# Patient Record
Sex: Female | Born: 1970 | Race: White | Hispanic: No | Marital: Married | Smoking: Never smoker
Health system: Southern US, Community
[De-identification: ages and names within clinical notes are randomized; demographics above are authoritative.]

## PROBLEM LIST (undated history)

## (undated) DIAGNOSIS — E785 Hyperlipidemia, unspecified: Secondary | ICD-10-CM

## (undated) DIAGNOSIS — N390 Urinary tract infection, site not specified: Secondary | ICD-10-CM

## (undated) DIAGNOSIS — L700 Acne vulgaris: Secondary | ICD-10-CM

## (undated) DIAGNOSIS — Z01419 Encounter for gynecological examination (general) (routine) without abnormal findings: Secondary | ICD-10-CM

## (undated) DIAGNOSIS — J189 Pneumonia, unspecified organism: Secondary | ICD-10-CM

## (undated) DIAGNOSIS — T7840XA Allergy, unspecified, initial encounter: Secondary | ICD-10-CM

## (undated) HISTORY — DX: Urinary tract infection, site not specified: N39.0

## (undated) HISTORY — DX: Acne vulgaris: L70.0

## (undated) HISTORY — DX: Encounter for gynecological examination (general) (routine) without abnormal findings: Z01.419

## (undated) HISTORY — DX: Allergy, unspecified, initial encounter: T78.40XA

## (undated) HISTORY — DX: Hyperlipidemia, unspecified: E78.5

## (undated) HISTORY — PX: NASAL SINUS SURGERY: SHX719

## (undated) HISTORY — DX: Pneumonia, unspecified organism: J18.9

---
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
## 1999-07-24 ENCOUNTER — Other Ambulatory Visit: Admission: RE | Admit: 1999-07-24 | Discharge: 1999-07-24 | Payer: Self-pay | Admitting: Gynecology

## 2000-07-12 ENCOUNTER — Inpatient Hospital Stay (HOSPITAL_COMMUNITY): Admission: AD | Admit: 2000-07-12 | Discharge: 2000-07-14 | Payer: Self-pay | Admitting: Gynecology

## 2000-07-15 ENCOUNTER — Encounter: Admission: RE | Admit: 2000-07-15 | Discharge: 2000-08-14 | Payer: Self-pay | Admitting: Gynecology

## 2000-09-09 ENCOUNTER — Other Ambulatory Visit: Admission: RE | Admit: 2000-09-09 | Discharge: 2000-09-09 | Payer: Self-pay | Admitting: Gynecology

## 2001-10-06 ENCOUNTER — Other Ambulatory Visit: Admission: RE | Admit: 2001-10-06 | Discharge: 2001-10-06 | Payer: Self-pay | Admitting: Gynecology

## 2002-11-16 ENCOUNTER — Other Ambulatory Visit: Admission: RE | Admit: 2002-11-16 | Discharge: 2002-11-16 | Payer: Self-pay | Admitting: Gynecology

## 2003-12-16 ENCOUNTER — Other Ambulatory Visit: Admission: RE | Admit: 2003-12-16 | Discharge: 2003-12-16 | Payer: Self-pay | Admitting: Gynecology

## 2005-04-11 ENCOUNTER — Other Ambulatory Visit: Admission: RE | Admit: 2005-04-11 | Discharge: 2005-04-11 | Payer: Self-pay | Admitting: Gynecology

## 2005-08-22 ENCOUNTER — Ambulatory Visit (HOSPITAL_COMMUNITY): Admission: RE | Admit: 2005-08-22 | Discharge: 2005-08-22 | Payer: Self-pay | Admitting: Gynecology

## 2005-08-22 ENCOUNTER — Encounter (INDEPENDENT_AMBULATORY_CARE_PROVIDER_SITE_OTHER): Payer: Self-pay | Admitting: *Deleted

## 2005-11-02 ENCOUNTER — Encounter (INDEPENDENT_AMBULATORY_CARE_PROVIDER_SITE_OTHER): Payer: Self-pay | Admitting: Specialist

## 2005-11-03 ENCOUNTER — Inpatient Hospital Stay (HOSPITAL_COMMUNITY): Admission: AD | Admit: 2005-11-03 | Discharge: 2005-11-05 | Payer: Self-pay | Admitting: Gynecology

## 2005-12-11 ENCOUNTER — Other Ambulatory Visit: Admission: RE | Admit: 2005-12-11 | Discharge: 2005-12-11 | Payer: Self-pay | Admitting: Gynecology

## 2006-08-20 ENCOUNTER — Ambulatory Visit: Payer: Self-pay | Admitting: Family Medicine

## 2007-12-24 ENCOUNTER — Encounter: Admission: RE | Admit: 2007-12-24 | Discharge: 2007-12-24 | Payer: Self-pay | Admitting: Gynecology

## 2008-01-21 ENCOUNTER — Encounter: Payer: Self-pay | Admitting: Gynecology

## 2008-01-21 ENCOUNTER — Ambulatory Visit: Payer: Self-pay | Admitting: Gynecology

## 2008-01-21 ENCOUNTER — Other Ambulatory Visit: Admission: RE | Admit: 2008-01-21 | Discharge: 2008-01-21 | Payer: Self-pay | Admitting: Gynecology

## 2008-01-21 DIAGNOSIS — J189 Pneumonia, unspecified organism: Secondary | ICD-10-CM

## 2008-01-21 HISTORY — DX: Pneumonia, unspecified organism: J18.9

## 2008-02-04 ENCOUNTER — Ambulatory Visit: Payer: Self-pay | Admitting: Family Medicine

## 2008-02-04 DIAGNOSIS — J159 Unspecified bacterial pneumonia: Secondary | ICD-10-CM | POA: Insufficient documentation

## 2008-02-04 DIAGNOSIS — E785 Hyperlipidemia, unspecified: Secondary | ICD-10-CM

## 2008-02-04 DIAGNOSIS — J309 Allergic rhinitis, unspecified: Secondary | ICD-10-CM | POA: Insufficient documentation

## 2008-02-26 ENCOUNTER — Ambulatory Visit: Payer: Self-pay | Admitting: Family Medicine

## 2008-02-26 DIAGNOSIS — R300 Dysuria: Secondary | ICD-10-CM

## 2008-02-26 LAB — CONVERTED CEMR LAB
Bilirubin Urine: NEGATIVE
Glucose, Urine, Semiquant: NEGATIVE
Protein, U semiquant: NEGATIVE
pH: 7.5

## 2008-09-13 ENCOUNTER — Ambulatory Visit: Payer: Self-pay | Admitting: Gynecology

## 2008-09-26 ENCOUNTER — Ambulatory Visit: Payer: Self-pay | Admitting: Gynecology

## 2008-10-12 ENCOUNTER — Ambulatory Visit: Payer: Self-pay | Admitting: Gynecology

## 2008-10-31 ENCOUNTER — Ambulatory Visit: Payer: Self-pay | Admitting: Gynecology

## 2009-10-10 ENCOUNTER — Ambulatory Visit: Payer: Self-pay | Admitting: Gynecology

## 2009-10-10 ENCOUNTER — Other Ambulatory Visit: Admission: RE | Admit: 2009-10-10 | Discharge: 2009-10-10 | Payer: Self-pay | Admitting: Gynecology

## 2009-10-25 ENCOUNTER — Ambulatory Visit: Payer: Self-pay | Admitting: Gynecology

## 2010-01-23 ENCOUNTER — Telehealth: Payer: Self-pay | Admitting: Family Medicine

## 2010-05-22 NOTE — Progress Notes (Signed)
Summary: Pt req med for lice. Pls call in today  Phone Note Call from Patient   Caller: Patient Call For: Jody Salisbury MD Summary of Call: Pt has found lice in scalp that she caught from her kids.  Is asking for Rx to be called in for herself and the her two girls.  Has been using OTC products, but would like something stronger especially for her.  [facility] [phone] Initial call taken by: Lynann Beaver CMA,  January 23, 2010 1:53 PM  Follow-up for Phone Call        Pt called re: status of med for lice. Pls call something in today to [facility] [place].      Follow-up by: Lucy Antigua,  January 23, 2010 4:42 PM  Additional Follow-up for Phone Call Additional follow up Details #1::        call in Ovide lotion, apply to scalp at bedtime and rinse off the next morning, one bottle with 2 rf  Additional Follow-up by: Jody Salisbury MD,  January 24, 2010 9:47 AM    Additional Follow-up for Phone Call Additional follow up Details #2::    [facility]  Follow-up by: Pura Spice, RN,  January 24, 2010 10:25 AM  New/Updated Medications: OVIDE 0.5 % LOTN (MALATHION) apply to scalp at bedtime and rinse off next am. Prescriptions: OVIDE 0.5 % LOTN (MALATHION) apply to scalp at bedtime and rinse off next am.  #1 x 2   Entered by:   Pura Spice, RN   Authorized by:   Jody Salisbury MD   Signed by:   Pura Spice, RN on 01/24/2010   Method used:   Electronically to        [id number] (retail)       [address]       [place], [place]  [address]       Ph: [phone]       Fax: [phone]   RxID:   [id number]

## 2010-09-04 NOTE — Assessment & Plan Note (Signed)
[facility]                                 ON-CALL NOTE   NAME:CUNNINGHAMNatori, Gudino                  MRN:          [id number]  DATE:02/01/2008                            DOB:          Sep 13, 1970    CHIEF COMPLAINT:  Fever and chest pain.   Phone number is [phone].  Regular doctor is Dr. Clent Ridges.  I am Dr. Milinda Antis on  call.   The patient states that she is running on a fever for 5 days.  She  suspects she has had the flu.  It has been up and down, now running  about 100.5.  She thinks she is getting steadily worse.  Her chest is  feeling very heavy.  She is sore.  She is not typically short of breath,  but coughing a lot with phlegm production.  She is worried that this may  be turning into something more than the flu.  She is currently on an  antibiotic for acne, she is unsure of the name and also on an antibiotic  for urinary tract infection, which is nitrofurantoin.  I advised her  that if she is worsening and if fever is not improving, and if she is  having chest pain or pressure that she needs to go to urgent care or the  emergency room now to be checked out for possible early pneumonia, and  to make sure that she does not need another antibiotic.  She said she is  unsure if she will do this, because she does not feel sick enough, and  that she will decide whether to go in tonight or call the office first  thing in the morning for a followup.  I again advised her that she  should go and get it checked out tonight as opposed to waiting until  tomorrow.     Marne A. Tower, MD  Electronically Signed    MAT/MedQ  DD: 02/01/2008  DT: 02/02/2008  Job #: [id number]

## 2010-09-07 NOTE — Discharge Summary (Signed)
[facility]  Patient:    Jody Massey, Jody Massey                MRN: [id number] Adm. Date:  [id number] Disc. Date: [id number] Attending:  Tonye Royalty Dictator:   Antony Contras, [facility]                           Discharge Summary  DISCHARGE DIAGNOSES:          1. Intrauterine pregnancy at term.                               2. Spontaneous onset of labor.  PROCEDURES:                   Mityvac assisted vaginal delivery over midline episiotomy with delivery of viable infant.  HISTORY OF PRESENT ILLNESS:   The patient is a 40 year old primigravida with an LMP of October 09, 1999, [facility] July 15, 2000.  Prenatal course was uncomplicated.  PRENATAL LABORATORY DATA:     Blood type O negative, antibody screen negative. RPR, HBsAg, HIV nonreactive.  Rubella immune.  MSAFP normal.  HOSPITAL COURSE:              The patient was admitted on July 12, 2000, with spontaneous rupture of membranes at 39-1/2 weeks.  Cervix was 2-3 cm, 90% effaced, -3 station.  Amniotic fluid was clear.  Pitocin augmentation was begun.  She progressed to complete dilatation.  Secondary to decreased expulsive efforts, delivery was accomplished per Mityvac by Dr. Lily Peer, over a midline episiotomy.  She was delivered of an Apgars 8 and 5 female infant weighing 7 pounds 4 ounces.  Placenta was delivered intact.  The patient did have some uterine atony and received Methergine 0.2 mg x 2. Postpartum course:  She remained afebrile, had no difficulty voiding, and was administered RhoGAM prior to discharge since the baby was Rh positive.  CBC: Hematocrit 31.5, hemoglobin 10.8, platelets 241.  FOLLOW-UP:                    In six weeks.  MEDICATIONS:                  Continue with prenatal vitamins and iron. Motrin and Tylox for pain. DD:  07/28/00 TD:  07/29/00 Job: [id number]
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
## 2010-09-07 NOTE — Op Note (Signed)
[name], Jody Massey         ACCOUNT NO.:  [id number]   MEDICAL RECORD NO.:  [id number]          PATIENT TYPE:  INP   LOCATION:  9145                          FACILITY:  [facility]   PHYSICIAN:  Ivor Costa. Farrel Gobble, M.D. DATE OF BIRTH:  [birth year]   DATE OF PROCEDURE:  11/03/2005  DATE OF DISCHARGE:                                 OPERATIVE REPORT   DATE OF PROCEDURE:  11/03/2005.   PREOPERATIVE DIAGNOSIS:  Combative patient requiring exam under anesthesia.   POSTOPERATIVE DIAGNOSIS:  Combative patient requiring exam under anesthesia.   PROCEDURE:  Exam under anesthesia, repair of second degree periurethral  laceration.   SURGEON:  Ivor Costa. Farrel Gobble, M.D.   ANESTHESIA:  General.   ESTIMATED BLOOD LOSS:  Minimal.   FINDINGS:  A second degree perineal laceration and a periurethral laceration  as well and a right labial laceration that was nonbleeding.  Her cervix was  without any lacerations.   INDICATIONS:  The patient is a 40 year old G2, P2, who presented to labor  and delivery and precipitously delivered going from 6 to delivery in 12  minutes.  The patient, however, was unable to tolerate an abdominal exam to  palpate the uterus order to aid in the delivery of the placenta despite  waiting over 20 minutes.  The patient still could not tolerate the slightest  pressure on her abdomen.  The patient had been given 2 of Stadol which only  made her more combative and Phenergan again just added to the situation.  We  poured water over her perineum to see if in reality she did have a  periurethral laceration which was hard to ascertain secondary to a clot, but  again, we were unsuccessful in clearly determining if a laceration was  present because the patient was becoming more and not less combative.  We  elected instead to transfer her to the OR.   PROCEDURE:  The patient was taken to the operating room where general  anesthesia was induced, placed in the dorsal lithotomy position,  and prepped  and draped in the usual sterile fashion.  At this point, a second degree  vaginal laceration was appreciated.  There was also noted to be a long  periurethral laceration which at this point seemed to be clotted and a right  labial which subsequently also was not bleeding.  The second degree  laceration was then repaired with 2-0 Vicryl and was noted to be hemostatic.  Exam under anesthesia showed the cervix to be intact.  There was noted to be  a moderate amount of clot in the lower segment which was evacuated.  The  uterus contracted nicely.  Of note, prior to the procedure, the bladder was  drained for clear urine.  The uterus stayed firm.  It was questionable  whether or not there was a small hematoma developing on the inferior aspect  of the periurethral  laceration; therefore, the area was plicated with 3-0 Vicryl.  The labial  lac was kept intact.  The patient was then extubated and transferred to the  recovery room in due fashion.  She tolerated the procedure well.  Lap,  sponge and needle counts were correct.      Ivor Costa. Farrel Gobble, M.D.  Electronically Signed     THL/MEDQ  D:  11/03/2005  T:  11/03/2005  Job:  [id number]

## 2012-01-22 ENCOUNTER — Encounter: Payer: Self-pay | Admitting: Family Medicine

## 2012-01-22 ENCOUNTER — Ambulatory Visit: Payer: Self-pay | Admitting: Family Medicine

## 2013-02-19 ENCOUNTER — Ambulatory Visit (INDEPENDENT_AMBULATORY_CARE_PROVIDER_SITE_OTHER): Payer: 59 | Admitting: Family Medicine

## 2013-02-19 ENCOUNTER — Telehealth: Payer: Self-pay | Admitting: Family Medicine

## 2013-02-19 ENCOUNTER — Encounter: Payer: Self-pay | Admitting: Family Medicine

## 2013-02-19 VITALS — BP 100/60 | HR 86 | Temp 99.0°F | Wt 200.0 lb

## 2013-02-19 DIAGNOSIS — R82998 Other abnormal findings in urine: Secondary | ICD-10-CM

## 2013-02-19 DIAGNOSIS — N390 Urinary tract infection, site not specified: Secondary | ICD-10-CM

## 2013-02-19 DIAGNOSIS — R8290 Unspecified abnormal findings in urine: Secondary | ICD-10-CM

## 2013-02-19 LAB — POCT URINALYSIS DIPSTICK
Bilirubin, UA: NEGATIVE
Glucose, UA: NEGATIVE
Nitrite, UA: NEGATIVE
Urobilinogen, UA: 0.2
pH, UA: 7

## 2013-02-19 MED ORDER — CIPROFLOXACIN HCL 500 MG PO TABS: 500.0000 mg | ORAL_TABLET | Freq: Two times a day (BID) | ORAL | Status: DC

## 2013-02-19 NOTE — Progress Notes (Signed)
  Subjective:    Patient ID: Jody Massey, female    DOB: January 17, 1971, 42 y.o.   MRN: [id number]  HPI 42 yr old female to re-establish and for a UTI. She has had several UTIs this year, and most of these were treated by Dr. Ernestina Penna, her GYN. She had a culture done in August but reportedly this showed "no growth". She cannot remember what antibiotics were used. She actually saw Urology once and had a CT scan done to rule out kidney stones. None were found. She typically drinks plenty of water. Now for 3 days she has cloudy foul smelling urine, burning, urgency, and some right lower back pains. No nausea or fever.    Review of Systems  Constitutional: Negative.   Gastrointestinal: Negative.   Genitourinary: Positive for dysuria, urgency, frequency and flank pain. Negative for hematuria.       Objective:   Physical Exam  Constitutional: She appears well-developed and well-nourished.  Cardiovascular: Normal rate, regular rhythm, normal heart sounds and intact distal pulses.   Pulmonary/Chest: Effort normal and breath sounds normal.  Abdominal: Soft. Bowel sounds are normal. She exhibits no distension and no mass. There is no tenderness. There is no rebound and no guarding.          Assessment & Plan:  Treat with Cipro. Culture the sample. Get records from GYN and Urology.

## 2013-02-19 NOTE — Telephone Encounter (Signed)
Pt is coming in today here for a office visit.

## 2013-02-19 NOTE — Telephone Encounter (Signed)
Pt will be seen MD for acute issues. Pt has not seen MD since 2009, Can I re-est pt?

## 2013-02-21 LAB — URINE CULTURE

## 2013-02-22 MED ORDER — SULFAMETHOXAZOLE-TMP DS 800-160 MG PO TABS: 1.0000 | ORAL_TABLET | Freq: Two times a day (BID) | ORAL | Status: AC

## 2013-02-22 NOTE — Progress Notes (Signed)
Quick Note:  I spoke with pt and she is not feeling any better. She started the antibiotic like prescribed and symptoms have not improved. Her urine is still cloudy and burns, also back pain and just not feeling good. ______

## 2013-02-22 NOTE — Progress Notes (Signed)
Quick Note:  I spoke with pt and sent script [facility] to [facility]. ______

## 2013-02-22 NOTE — Addendum Note (Signed)
Addended by: Aniceto Boss A on: 02/22/2013 10:47 AM   Modules accepted: Orders

## 2013-03-19 ENCOUNTER — Telehealth: Payer: Self-pay | Admitting: Family Medicine

## 2013-03-19 NOTE — Telephone Encounter (Signed)
Patient Information:  Caller Name: Adrieanna  Phone: [phone]  Patient: Jody Massey, Jody Massey  Gender: Female  DOB: 1971-01-12  Age: 42 Years  PCP: Gershon [facility])  Pregnant: No  Office Follow Up:  Does the office need to follow up with this patient?: Yes  Instructions For The Office: Patient states she is out of town on vacation. Patient advised to be seen in a local Urgent Care for evaluation since she is out of town. Patient is requesting a Rx for an antibiotic to be called into [facility], [address] in [place] at [phone]. Patient can be reached at [phone].  RN Note:  Patient states she has history of recurrent urinary tract infections. Patient states she developed cloudy urine and right flank pain, onset 03/18/13. States she is passing her urine normally without pain or burning. Patient states she also has right groin discomfort. Care advice given per guidelines. Patient states she is out of town on vacation. Patient advised to be seen in a local Urgent Care for evaluation since she is out of town. Patient is requesting a Rx for an antibiotic to be called into [facility], [address] in [place] at [phone]. Patient can be reached at [phone]. Call back parameters reviewed. Patient verbalizes understanding. Patient advised that Dr. Clent Ridges is not in the office 03/19/13 and advised to be seen at the local Urgent Care for evaluation.  Patient verbalizes understanding and agreeable.  Symptoms  Reason For Call & Symptoms: Cloudy urine, flank pain  Reviewed Health History In EMR: Yes  Reviewed Medications In EMR: Yes  Reviewed Allergies In EMR: Yes  Reviewed Surgeries / Procedures: Yes  Date of Onset of Symptoms: 03/18/2013  Treatments Tried: Increased fluids  Treatments Tried Worked: No OB / GYN:  LMP: 03/03/2013  Guideline(s) Used:  Flank Pain  Disposition Per Guideline:   Go to ED  Now (or to Office with PCP Approval)  Reason For Disposition Reached:   Pain radiates into groin, scrotum  Advice Given:  Pain Medicines:  For pain relief, you can take either acetaminophen, ibuprofen, or naproxen.  Call Back If:  Fever over 100.5 F (38.1 C)  Burning with urination or blood in urine  You become worse.  Patient Refused Recommendation:  Patient Requests Prescription  Patient states she is out of town on vacation. Patient advised to be seen in a local Urgent Care for evaluation since she is out of town. Patient is requesting a Rx for an antibiotic to be called into [facility], [address] in [place] at [phone]. Patient can be reached at [phone]. Patient informed that Dr. Clent Ridges is not in the office 03/19/13. Patient advised to be seen at the local Urgent Care since she is out of town. Patient verbalizes understanding and agreeable.

## 2014-12-16 ENCOUNTER — Encounter: Payer: Self-pay | Admitting: Family Medicine

## 2014-12-16 ENCOUNTER — Ambulatory Visit (INDEPENDENT_AMBULATORY_CARE_PROVIDER_SITE_OTHER): Payer: 59 | Admitting: Family Medicine

## 2014-12-16 VITALS — BP 108/79 | HR 82 | Temp 100.9°F | Ht 68.0 in | Wt 232.0 lb

## 2014-12-16 DIAGNOSIS — M791 Myalgia: Secondary | ICD-10-CM

## 2014-12-16 DIAGNOSIS — IMO0001 Reserved for inherently not codable concepts without codable children: Secondary | ICD-10-CM

## 2014-12-16 DIAGNOSIS — M609 Myositis, unspecified: Secondary | ICD-10-CM | POA: Diagnosis not present

## 2014-12-16 LAB — CBC WITH DIFFERENTIAL/PLATELET
BASOS ABS: 0 10*3/uL (ref 0.0–0.1)
BASOS PCT: 0.3 % (ref 0.0–3.0)
EOS ABS: 0.1 10*3/uL (ref 0.0–0.7)
Eosinophils Relative: 0.7 % (ref 0.0–5.0)
HCT: 42.1 % (ref 36.0–46.0)
Hemoglobin: 14.2 g/dL (ref 12.0–15.0)
LYMPHS ABS: 3.1 10*3/uL (ref 0.7–4.0)
LYMPHS PCT: 33.3 % (ref 12.0–46.0)
MCHC: 33.7 g/dL (ref 30.0–36.0)
MCV: 89.1 fl (ref 78.0–100.0)
MONO ABS: 0.6 10*3/uL (ref 0.1–1.0)
Monocytes Relative: 5.9 % (ref 3.0–12.0)
NEUTROS ABS: 5.6 10*3/uL (ref 1.4–7.7)
Neutrophils Relative %: 59.8 % (ref 43.0–77.0)
Platelets: 309 10*3/uL (ref 150.0–400.0)
RBC: 4.72 Mil/uL (ref 3.87–5.11)
RDW: 13.5 % (ref 11.5–15.5)
WBC: 9.4 10*3/uL (ref 4.0–10.5)

## 2014-12-16 LAB — HEPATIC FUNCTION PANEL
ALBUMIN: 4.2 g/dL (ref 3.5–5.2)
ALT: 14 U/L (ref 0–35)
AST: 17 U/L (ref 0–37)
Alkaline Phosphatase: 71 U/L (ref 39–117)
Bilirubin, Direct: 0 mg/dL (ref 0.0–0.3)
TOTAL PROTEIN: 8.2 g/dL (ref 6.0–8.3)
Total Bilirubin: 0.2 mg/dL (ref 0.2–1.2)

## 2014-12-16 LAB — POCT URINALYSIS DIPSTICK
Bilirubin, UA: NEGATIVE
Glucose, UA: NEGATIVE
KETONES UA: NEGATIVE
Nitrite, UA: NEGATIVE
PH UA: 6.5
PROTEIN UA: NEGATIVE
SPEC GRAV UA: 1.025
Urobilinogen, UA: 0.2

## 2014-12-16 LAB — C-REACTIVE PROTEIN: CRP: 1.2 mg/dL (ref 0.5–20.0)

## 2014-12-16 LAB — TSH: TSH: 3.91 u[IU]/mL (ref 0.35–4.50)

## 2014-12-16 LAB — BASIC METABOLIC PANEL
BUN: 11 mg/dL (ref 6–23)
CO2: 28 meq/L (ref 19–32)
Calcium: 9.9 mg/dL (ref 8.4–10.5)
Chloride: 103 mEq/L (ref 96–112)
Creatinine, Ser: 0.81 mg/dL (ref 0.40–1.20)
GFR: 82.3 mL/min (ref >60.00)
GLUCOSE: 82 mg/dL (ref 70–99)
POTASSIUM: 4.5 meq/L (ref 3.5–5.1)
Sodium: 140 mEq/L (ref 135–145)

## 2014-12-16 LAB — RHEUMATOID FACTOR: Rhuematoid fact SerPl-aCnc: <10 IU/mL (ref <=14)

## 2014-12-16 LAB — SEDIMENTATION RATE: Sed Rate: 24 mm/hr — ABNORMAL HIGH (ref 0–22)

## 2014-12-16 LAB — CK: CK TOTAL: 54 U/L (ref 7–177)

## 2014-12-16 NOTE — Progress Notes (Signed)
   Subjective:    Patient ID: Jody Massey, female    DOB: [birth year], 44 y.o.   MRN: [id number]  HPI Here for several months of symptoms including fatigue, low grade fevers, diffuse muscle aches, and headaches. No neck pains, no joint swelling. No light sensitivity or nausea wit the HAs. No change in BMs or urinations. No skin rashes. No hx of tick bites. Er only travel history in the past year was a family cruise to the [place]. No changes in medication. She takes Ibuprofen at times and it helps, but does not last long.    Review of Systems  Constitutional: Positive for fever and fatigue. Negative for chills, diaphoresis, activity change, appetite change and unexpected weight change.  Eyes: Negative.   Respiratory: Negative.   Cardiovascular: Negative.   Gastrointestinal: Negative.   Endocrine: Negative.   Genitourinary: Negative.   Musculoskeletal: Positive for myalgias. Negative for back pain, joint swelling, arthralgias, gait problem, neck pain and neck stiffness.  Skin: Negative.   Neurological: Positive for headaches. Negative for dizziness, tremors, seizures, syncope, facial asymmetry, speech difficulty, weakness, light-headedness and numbness.  Hematological: Negative.        Objective:   Physical Exam  Constitutional: She is oriented to person, place, and time. She appears well-developed and well-nourished. No distress.  Eyes: Conjunctivae and EOM are normal. Pupils are equal, round, and reactive to light.  Neck: Normal range of motion. Neck supple. No thyromegaly present.  Cardiovascular: Normal rate, regular rhythm, normal heart sounds and intact distal pulses.   Pulmonary/Chest: Breath sounds normal. No respiratory distress. She has no wheezes. She has no rales. She exhibits no tenderness.  Abdominal: Soft. Bowel sounds are normal. She exhibits no distension and no mass. There is no tenderness. There is no rebound and no guarding.  Musculoskeletal: Normal range of  motion. She exhibits no edema or tenderness.  Lymphadenopathy:    She has no cervical adenopathy.  Neurological: She is alert and oriented to person, place, and time. No cranial nerve deficit. Coordination normal.  Skin: Skin is warm. No rash noted. No erythema.          Assessment & Plan:  Fatigue, fevers, and myalgias of uncertain etiology. We will investigate with labs today. Try taking 2 Aleve tablets bid.

## 2014-12-16 NOTE — Progress Notes (Signed)
Pre visit review using our clinic review tool, if applicable. No additional management support is needed unless otherwise documented below in the visit note. 

## 2014-12-19 LAB — B. BURGDORFI ANTIBODIES: B BURGDORFERI AB IGG+ IGM: 0.21 {ISR}

## 2016-02-02 ENCOUNTER — Other Ambulatory Visit: Payer: Self-pay | Admitting: Obstetrics

## 2016-02-02 DIAGNOSIS — R928 Other abnormal and inconclusive findings on diagnostic imaging of breast: Secondary | ICD-10-CM

## 2016-02-08 ENCOUNTER — Ambulatory Visit
Admission: RE | Admit: 2016-02-08 | Discharge: 2016-02-08 | Disposition: A | Discharge status: Home / Self Care | Payer: 59 | Source: Ambulatory Visit | Attending: Obstetrics | Admitting: Obstetrics

## 2016-02-08 DIAGNOSIS — R928 Other abnormal and inconclusive findings on diagnostic imaging of breast: Secondary | ICD-10-CM

## 2016-05-13 ENCOUNTER — Ambulatory Visit: Payer: 59 | Admitting: Family Medicine

## 2017-01-09 ENCOUNTER — Encounter: Payer: Self-pay | Admitting: Family Medicine

## 2018-03-17 ENCOUNTER — Other Ambulatory Visit: Payer: Self-pay | Admitting: Obstetrics

## 2018-03-17 DIAGNOSIS — N63 Unspecified lump in unspecified breast: Secondary | ICD-10-CM

## 2018-03-26 ENCOUNTER — Ambulatory Visit
Admission: RE | Admit: 2018-03-26 | Discharge: 2018-03-26 | Disposition: A | Discharge status: Home / Self Care | Payer: 59 | Source: Ambulatory Visit | Attending: Obstetrics | Admitting: Obstetrics

## 2018-03-26 DIAGNOSIS — N63 Unspecified lump in unspecified breast: Secondary | ICD-10-CM

## 2019-06-23 IMAGING — MG DIGITAL DIAGNOSTIC UNILATERAL RIGHT MAMMOGRAM WITH TOMO AND CAD
4 series · 4 of 12 positions shown · non-contrast
Comparison: Previous exam(s).

CLINICAL DATA: Screening recall for right breast mass.

EXAM:
DIGITAL DIAGNOSTIC UNILATERAL RIGHT MAMMOGRAM WITH CAD AND TOMO
RIGHT BREAST ULTRASOUND

[R CC synth-2D]
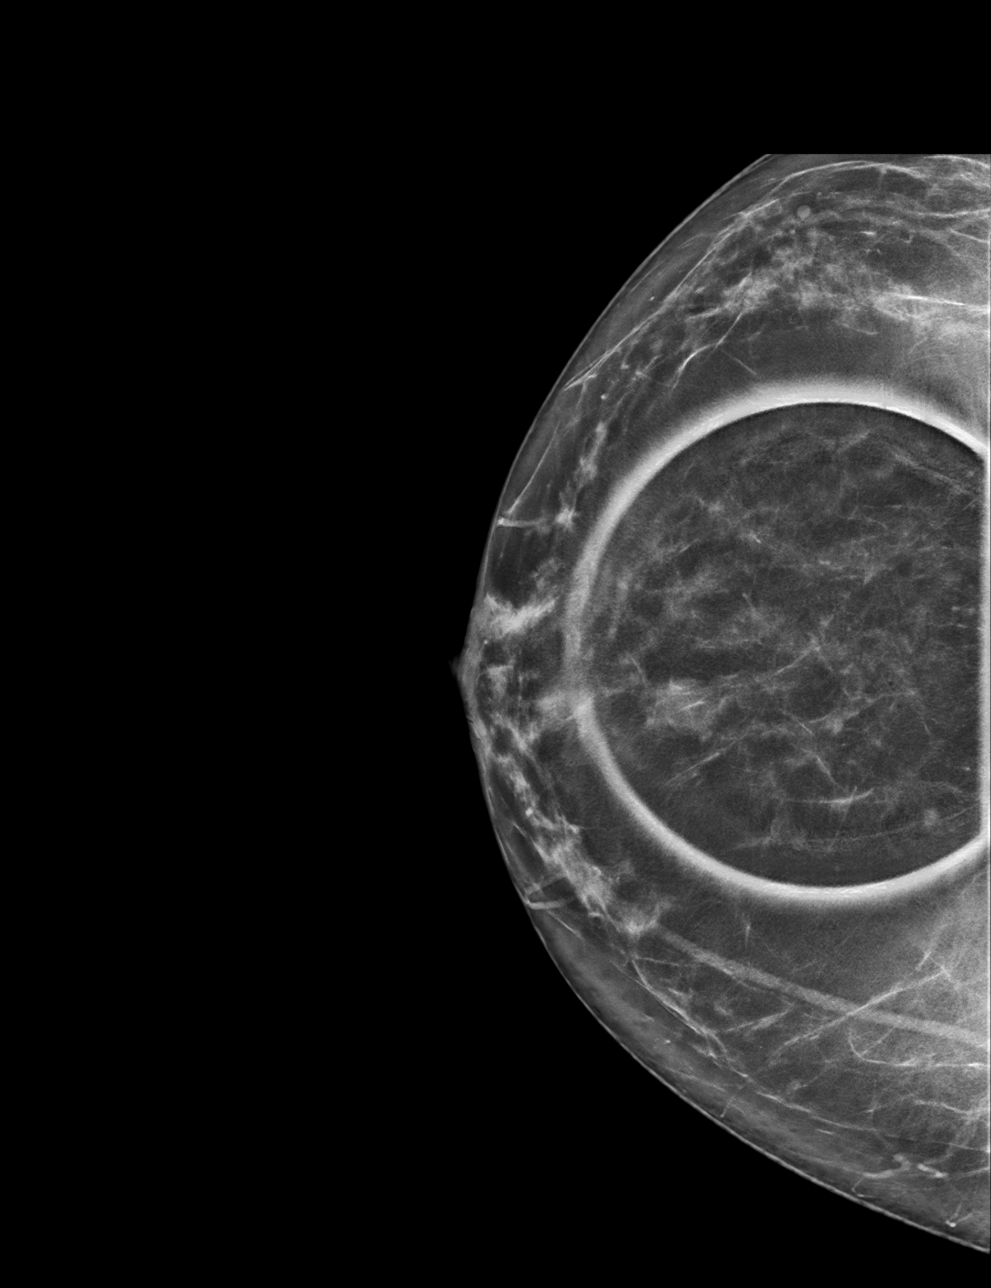

[R MLO synth-2D]
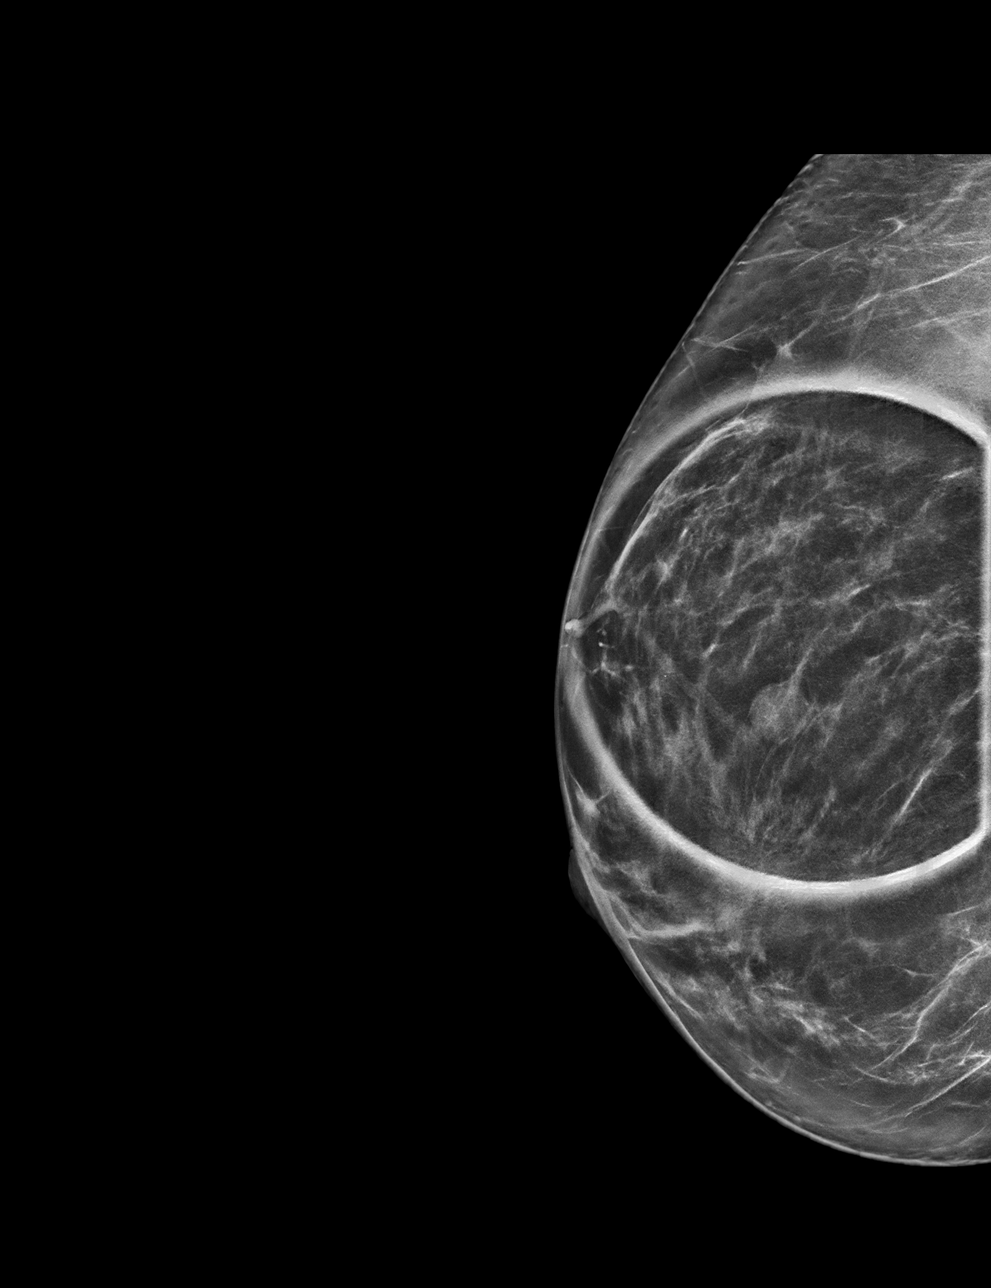

[R MLO tomo · tomo slice 40/79.0]
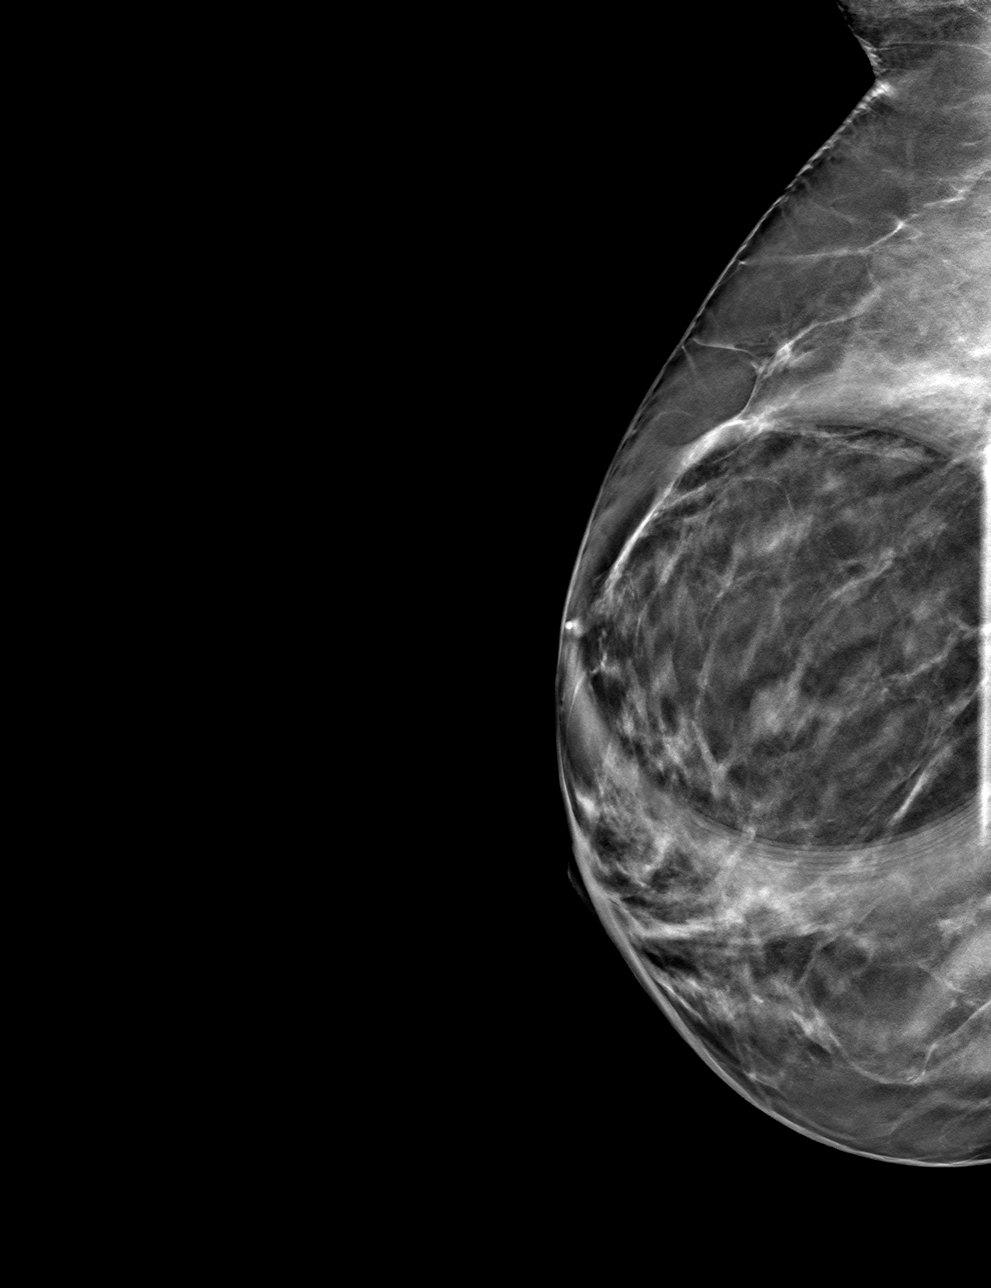

[R CC tomo · tomo slice 36/71.0]
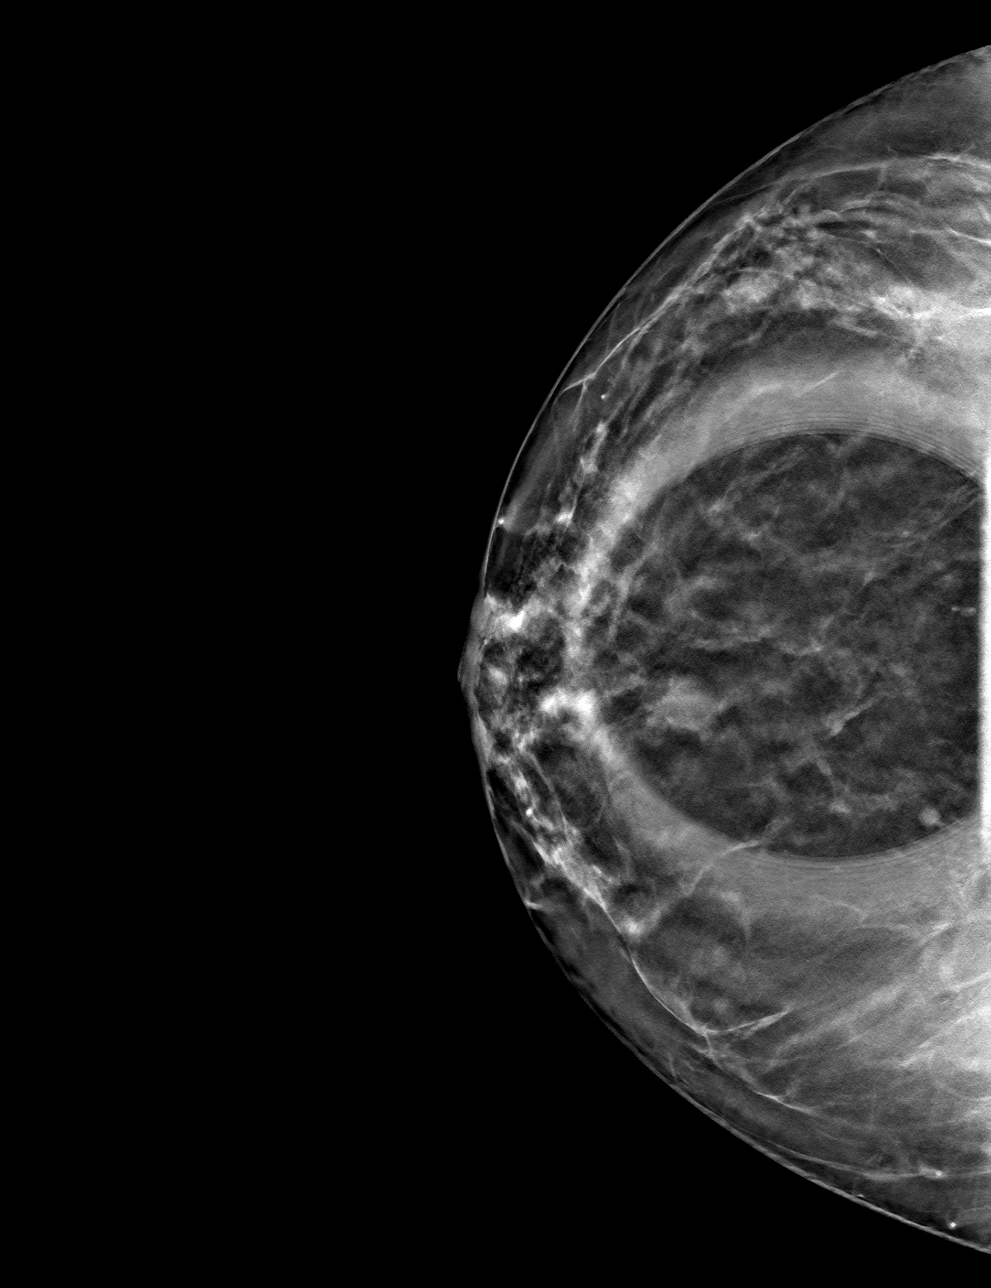

[4 of 12 positions shown; findings below may reference images not displayed]

ACR Breast Density Category c: The breast tissue is heterogeneously
dense, which may obscure small masses.
FINDINGS: Spot compression tomograms were performed of the right breast.
There's an oval circumscribed mass in the central to slightly upper
right breast measuring approximately 1 cm.

Mammographic images were processed with CAD.

Targeted ultrasound of the central and upper right breast was
performed. There is a cyst at 12 o'clock 3 cm from nipple measuring
0.9 x 0.8 x 0.8 cm. This corresponds well with the mass seen in the
right breast at mammography.
IMPRESSION: Right breast cyst.  No findings of malignancy in the right breast.

RECOMMENDATION:
Recommend annual routine screening mammography, due February 2019.

I have discussed the findings and recommendations with the patient.
Results were also provided in writing at the conclusion of the
visit. If applicable, a reminder letter will be sent to the patient
regarding the next appointment.

BI-RADS CATEGORY  2: Benign.

## 2019-12-14 DIAGNOSIS — B373 Candidiasis of vulva and vagina: Secondary | ICD-10-CM | POA: Diagnosis not present

## 2019-12-14 DIAGNOSIS — N760 Acute vaginitis: Secondary | ICD-10-CM | POA: Diagnosis not present

## 2019-12-29 DIAGNOSIS — R350 Frequency of micturition: Secondary | ICD-10-CM | POA: Diagnosis not present

## 2019-12-29 DIAGNOSIS — B373 Candidiasis of vulva and vagina: Secondary | ICD-10-CM | POA: Diagnosis not present

## 2020-01-25 DIAGNOSIS — R5081 Fever presenting with conditions classified elsewhere: Secondary | ICD-10-CM | POA: Diagnosis not present

## 2020-01-25 DIAGNOSIS — Z20822 Contact with and (suspected) exposure to covid-19: Secondary | ICD-10-CM | POA: Diagnosis not present

## 2020-01-25 DIAGNOSIS — J029 Acute pharyngitis, unspecified: Secondary | ICD-10-CM | POA: Diagnosis not present

## 2020-04-19 DIAGNOSIS — L700 Acne vulgaris: Secondary | ICD-10-CM | POA: Diagnosis not present
